# Patient Record
Sex: Female | Born: 1975 | Race: White | Hispanic: No | Marital: Married | Smoking: Never smoker
Health system: Southern US, Community
[De-identification: ages and names within clinical notes are randomized; demographics above are authoritative.]

## PROBLEM LIST (undated history)

## (undated) ENCOUNTER — Ambulatory Visit: Admission: EM | Payer: Self-pay

## (undated) DIAGNOSIS — I371 Nonrheumatic pulmonary valve insufficiency: Secondary | ICD-10-CM

## (undated) DIAGNOSIS — G479 Sleep disorder, unspecified: Secondary | ICD-10-CM

## (undated) DIAGNOSIS — H53149 Visual discomfort, unspecified: Secondary | ICD-10-CM

## (undated) DIAGNOSIS — I493 Ventricular premature depolarization: Secondary | ICD-10-CM

## (undated) DIAGNOSIS — I340 Nonrheumatic mitral (valve) insufficiency: Secondary | ICD-10-CM

## (undated) DIAGNOSIS — I272 Pulmonary hypertension, unspecified: Secondary | ICD-10-CM

## (undated) DIAGNOSIS — I341 Nonrheumatic mitral (valve) prolapse: Secondary | ICD-10-CM

## (undated) DIAGNOSIS — R519 Headache, unspecified: Secondary | ICD-10-CM

## (undated) DIAGNOSIS — I362 Nonrheumatic tricuspid (valve) stenosis with insufficiency: Secondary | ICD-10-CM

## (undated) DIAGNOSIS — R9431 Abnormal electrocardiogram [ECG] [EKG]: Secondary | ICD-10-CM

## (undated) DIAGNOSIS — R0602 Shortness of breath: Secondary | ICD-10-CM

---

## 1999-07-16 HISTORY — PX: LAPAROSCOPIC HYSTERECTOMY: SHX1926

## 2004-05-01 ENCOUNTER — Ambulatory Visit: Payer: Self-pay | Admitting: Family Medicine

## 2012-04-09 ENCOUNTER — Emergency Department: Payer: Self-pay | Admitting: Emergency Medicine

## 2012-04-12 ENCOUNTER — Emergency Department: Payer: Self-pay | Admitting: Emergency Medicine

## 2012-12-17 ENCOUNTER — Ambulatory Visit: Payer: Self-pay | Admitting: Family Medicine

## 2013-01-20 ENCOUNTER — Ambulatory Visit: Payer: Self-pay | Admitting: Family Medicine

## 2017-10-01 ENCOUNTER — Other Ambulatory Visit: Payer: Self-pay | Admitting: Physician Assistant

## 2017-10-01 DIAGNOSIS — Z1231 Encounter for screening mammogram for malignant neoplasm of breast: Secondary | ICD-10-CM

## 2020-09-25 ENCOUNTER — Other Ambulatory Visit: Payer: Self-pay | Admitting: Family Medicine

## 2020-09-25 ENCOUNTER — Ambulatory Visit
Admission: RE | Admit: 2020-09-25 | Discharge: 2020-09-25 | Disposition: A | Discharge status: Home / Self Care | Payer: BC Managed Care – PPO | Attending: Family Medicine | Admitting: Family Medicine

## 2020-09-25 ENCOUNTER — Other Ambulatory Visit: Payer: Self-pay

## 2020-09-25 ENCOUNTER — Ambulatory Visit
Admission: RE | Admit: 2020-09-25 | Discharge: 2020-09-25 | Disposition: A | Discharge status: Home / Self Care | Payer: BC Managed Care – PPO | Source: Ambulatory Visit | Attending: Family Medicine | Admitting: Family Medicine

## 2020-09-25 DIAGNOSIS — S8012XA Contusion of left lower leg, initial encounter: Secondary | ICD-10-CM

## 2021-08-15 ENCOUNTER — Ambulatory Visit
Admission: RE | Admit: 2021-08-15 | Discharge: 2021-08-15 | Disposition: A | Discharge status: Home / Self Care | Payer: 59 | Source: Ambulatory Visit | Attending: Family Medicine | Admitting: Family Medicine

## 2021-08-15 ENCOUNTER — Other Ambulatory Visit: Payer: Self-pay | Admitting: Family Medicine

## 2021-08-15 DIAGNOSIS — M79671 Pain in right foot: Secondary | ICD-10-CM | POA: Insufficient documentation

## 2021-08-15 DIAGNOSIS — S9032XA Contusion of left foot, initial encounter: Secondary | ICD-10-CM

## 2021-12-03 ENCOUNTER — Other Ambulatory Visit: Payer: Self-pay | Admitting: Physician Assistant

## 2021-12-03 DIAGNOSIS — R519 Headache, unspecified: Secondary | ICD-10-CM

## 2021-12-05 ENCOUNTER — Ambulatory Visit
Admission: RE | Admit: 2021-12-05 | Discharge: 2021-12-05 | Disposition: A | Discharge status: Home / Self Care | Payer: 59 | Source: Ambulatory Visit | Attending: Physician Assistant | Admitting: Physician Assistant

## 2021-12-05 ENCOUNTER — Encounter: Payer: Self-pay | Admitting: Radiology

## 2021-12-05 DIAGNOSIS — R519 Headache, unspecified: Secondary | ICD-10-CM | POA: Diagnosis not present

## 2021-12-05 MED ORDER — GADOBUTROL 1 MMOL/ML IV SOLN
6.0000 mL | Freq: Once | INTRAVENOUS | Status: AC | PRN
  Administered 2021-12-05: 6 mL via INTRAVENOUS

## 2022-12-14 NOTE — Anesthesia Preprocedure Evaluation (Signed)
Anesthesia Evaluation  Patient identified by MRN, date of birth, ID band Patient awake    Reviewed: Allergy & Precautions, NPO status , Patient's Chart, lab work & pertinent test results  History of Anesthesia Complications Negative for: history of anesthetic complications  Airway Mallampati: II  TM Distance: >3 FB Neck ROM: Full    Dental no notable dental hx. 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  Rhythm:Regular Rate:Normal - Systolic murmurs TTE 2024:  INTERPRETATION  MILD LV SYSTOLIC DYSFUNCTION (See above)   WITH MILD LVH  NORMAL RIGHT VENTRICULAR SYSTOLIC FUNCTION  MODERATE VALVULAR REGURGITATION (See above)  NO VALVULAR STENOSIS  MODERATE to SEVERE MR  SEVERE LA ENLARGEMENT  MILD TR, PR  EF 45%  _______________     Neuro/Psych negative neurological ROS  negative psych ROS   GI/Hepatic ,neg GERD  ,,(+)     (-) substance abuse    Endo/Other  neg diabetes    Renal/GU negative Renal ROS     Musculoskeletal   Abdominal   Peds  Hematology   Anesthesia Other Findings No past medical history on file.  Reproductive/Obstetrics                             Anesthesia Physical Anesthesia Plan  ASA: 2  Anesthesia Plan: General   Post-op Pain Management: Minimal or no pain anticipated   Induction: Intravenous  PONV Risk Score and Plan: 3 and Propofol infusion, TIVA and Ondansetron  Airway Management Planned: Nasal Cannula  Additional Equipment: None  Intra-op Plan:   Post-operative Plan:   Informed Consent: I have reviewed the patients History and  Physical, chart, labs and discussed the procedure including the risks, benefits and alternatives for the proposed anesthesia with the patient or authorized representative who has indicated his/her understanding and acceptance.     Dental advisory given  Plan Discussed with: CRNA and Surgeon  Anesthesia Plan Comments: (Discussed risks of anesthesia with patient, including possibility of difficulty with spontaneous ventilation under anesthesia necessitating airway intervention, PONV, and rare risks such as cardiac or respiratory or neurological events, and allergic reactions. Discussed the role of CRNA in patient's perioperative care. Patient understands.)       Anesthesia Quick Evaluation

## 2022-12-16 ENCOUNTER — Encounter
Admission: RE | Disposition: A | Discharge status: Home / Self Care | Payer: Self-pay | Source: Home / Self Care | Attending: Cardiovascular Disease

## 2022-12-16 ENCOUNTER — Ambulatory Visit
Admission: RE | Admit: 2022-12-16 | Discharge: 2022-12-16 | Disposition: A | Discharge status: Home / Self Care | Payer: 59 | Source: Home / Self Care | Attending: Cardiology | Admitting: Cardiology

## 2022-12-16 ENCOUNTER — Ambulatory Visit: Payer: 59 | Admitting: Anesthesiology

## 2022-12-16 ENCOUNTER — Ambulatory Visit
Admission: RE | Admit: 2022-12-16 | Discharge: 2022-12-16 | Disposition: A | Discharge status: Home / Self Care | Payer: 59 | Attending: Cardiovascular Disease | Admitting: Cardiovascular Disease

## 2022-12-16 ENCOUNTER — Encounter: Payer: Self-pay | Admitting: Cardiovascular Disease

## 2022-12-16 DIAGNOSIS — I340 Nonrheumatic mitral (valve) insufficiency: Secondary | ICD-10-CM | POA: Insufficient documentation

## 2022-12-16 DIAGNOSIS — I341 Nonrheumatic mitral (valve) prolapse: Secondary | ICD-10-CM | POA: Diagnosis not present

## 2022-12-16 DIAGNOSIS — I517 Cardiomegaly: Secondary | ICD-10-CM | POA: Diagnosis not present

## 2022-12-16 HISTORY — DX: Ventricular premature depolarization: I49.3

## 2022-12-16 HISTORY — DX: Pulmonary hypertension, unspecified: I27.20

## 2022-12-16 HISTORY — DX: Shortness of breath: R06.02

## 2022-12-16 HISTORY — DX: Nonrheumatic tricuspid (valve) stenosis with insufficiency: I36.2

## 2022-12-16 HISTORY — DX: Nonrheumatic pulmonary valve insufficiency: I37.1

## 2022-12-16 HISTORY — DX: Abnormal electrocardiogram (ECG) (EKG): R94.31

## 2022-12-16 HISTORY — DX: Nonrheumatic mitral (valve) prolapse: I34.1

## 2022-12-16 HISTORY — DX: Nonrheumatic mitral (valve) insufficiency: I34.0

## 2022-12-16 HISTORY — DX: Visual discomfort, unspecified: H53.149

## 2022-12-16 HISTORY — DX: Headache, unspecified: R51.9

## 2022-12-16 HISTORY — PX: TEE WITHOUT CARDIOVERSION: SHX5443

## 2022-12-16 HISTORY — DX: Sleep disorder, unspecified: G47.9

## 2022-12-16 LAB — COMPREHENSIVE METABOLIC PANEL
ALT: 14 U/L (ref 0–44)
AST: 19 U/L (ref 15–41)
Albumin: 4.2 g/dL (ref 3.5–5.0)
Alkaline Phosphatase: 43 U/L (ref 38–126)
Anion gap: 11 (ref 5–15)
BUN: 12 mg/dL (ref 6–20)
CO2: 24 mmol/L (ref 22–32)
Calcium: 8.9 mg/dL (ref 8.9–10.3)
Chloride: 106 mmol/L (ref 98–111)
Creatinine, Ser: 0.93 mg/dL (ref 0.44–1.00)
GFR, Estimated: >60 mL/min (ref >60)
Glucose, Bld: 93 mg/dL (ref 70–99)
Potassium: 3.9 mmol/L (ref 3.5–5.1)
Sodium: 141 mmol/L (ref 135–145)
Total Bilirubin: 1 mg/dL (ref 0.3–1.2)
Total Protein: 6.7 g/dL (ref 6.5–8.1)

## 2022-12-16 LAB — ECHO TEE
MV M vel: 5.36 m/s
MV Peak grad: 114.9 mmHg
Radius: 0.9 cm

## 2022-12-16 LAB — CBC
HCT: 38.6 % (ref 36.0–46.0)
Hemoglobin: 12.8 g/dL (ref 12.0–15.0)
MCH: 30.6 pg (ref 26.0–34.0)
MCHC: 33.2 g/dL (ref 30.0–36.0)
MCV: 92.3 fL (ref 80.0–100.0)
Platelets: 191 10*3/uL (ref 150–400)
RBC: 4.18 MIL/uL (ref 3.87–5.11)
RDW: 12.2 % (ref 11.5–15.5)
WBC: 4.5 10*3/uL (ref 4.0–10.5)
nRBC: 0 % (ref 0.0–0.2)

## 2022-12-16 SURGERY — ECHOCARDIOGRAM, TRANSESOPHAGEAL
Anesthesia: General

## 2022-12-16 MED ORDER — PHENYLEPHRINE HCL (PRESSORS) 10 MG/ML IV SOLN
INTRAVENOUS | Status: AC
  Filled 2022-12-16: qty 1

## 2022-12-16 MED ORDER — PROPOFOL 10 MG/ML IV BOLUS
INTRAVENOUS | Status: DC | PRN
  Administered 2022-12-16: 20 mg via INTRAVENOUS
  Administered 2022-12-16: 60 mg via INTRAVENOUS
  Administered 2022-12-16: 30 mg via INTRAVENOUS
  Administered 2022-12-16: 40 mg via INTRAVENOUS
  Administered 2022-12-16: 20 mg via INTRAVENOUS
  Administered 2022-12-16: 10 mg via INTRAVENOUS
  Administered 2022-12-16 (×3): 20 mg via INTRAVENOUS
  Administered 2022-12-16: 40 mg via INTRAVENOUS
  Administered 2022-12-16: 30 mg via INTRAVENOUS
  Administered 2022-12-16: 20 mg via INTRAVENOUS

## 2022-12-16 MED ORDER — PROPOFOL 10 MG/ML IV BOLUS
INTRAVENOUS | Status: AC
  Filled 2022-12-16: qty 20

## 2022-12-16 MED ORDER — MIDAZOLAM HCL 2 MG/2ML IJ SOLN
INTRAMUSCULAR | Status: DC | PRN
  Administered 2022-12-16: 2 mg via INTRAVENOUS

## 2022-12-16 MED ORDER — BUTAMBEN-TETRACAINE-BENZOCAINE 2-2-14 % EX AERO
3.0000 | INHALATION_SPRAY | Freq: Once | CUTANEOUS | Status: AC
  Administered 2022-12-16: 3 via TOPICAL
  Filled 2022-12-16: qty 20

## 2022-12-16 MED ORDER — SUCCINYLCHOLINE CHLORIDE 200 MG/10ML IV SOSY
PREFILLED_SYRINGE | INTRAVENOUS | Status: AC
  Filled 2022-12-16: qty 10

## 2022-12-16 MED ORDER — LIDOCAINE VISCOUS HCL 2 % MT SOLN
OROMUCOSAL | Status: AC
  Filled 2022-12-16: qty 15

## 2022-12-16 MED ORDER — BUTAMBEN-TETRACAINE-BENZOCAINE 2-2-14 % EX AERO
INHALATION_SPRAY | CUTANEOUS | Status: AC
  Filled 2022-12-16: qty 5

## 2022-12-16 MED ORDER — EPHEDRINE 5 MG/ML INJ
INTRAVENOUS | Status: AC
  Filled 2022-12-16: qty 5

## 2022-12-16 MED ORDER — LIDOCAINE HCL (CARDIAC) PF 100 MG/5ML IV SOSY
PREFILLED_SYRINGE | INTRAVENOUS | Status: DC | PRN
  Administered 2022-12-16: 50 mg via INTRAVENOUS

## 2022-12-16 MED ORDER — PROPOFOL 1000 MG/100ML IV EMUL
INTRAVENOUS | Status: AC
  Filled 2022-12-16: qty 100

## 2022-12-16 MED ORDER — MIDAZOLAM HCL 2 MG/2ML IJ SOLN
INTRAMUSCULAR | Status: AC
  Filled 2022-12-16: qty 2

## 2022-12-16 MED ORDER — SODIUM CHLORIDE 0.9 % IV SOLN
INTRAVENOUS | Status: DC
  Administered 2022-12-16: 1000 mL via INTRAVENOUS

## 2022-12-16 NOTE — Progress Notes (Signed)
Transesophageal echocardiogram preliminary report  Jillian Washington [id number] May 27, 1976  Preliminary diagnosis Symptomatic valvular heart disease  Postprocedural diagnosis same  Time out A timeout was performed by the nursing staff and physicians specifically identifying the procedure performed, identification of the patient, the type of sedation, all allergies and medications, all pertinent medical history, and presedation assessment of nasopharynx. The patient and or family understand the risks of the procedure including the rare risks of death, stroke, heart attack, esophogeal perforation, sore throat, and reaction to medications given.  General anesthesia CRNA administered GA (330 mg of propofol).  The patient had continued monitoring of heart rate, oxygenation, blood pressure, respiratory rate, and extent of signs of sedation throughout the entire procedure.  The patient received anesthesia over a period of 20 minutes.  CRNA, nursing staff and I were present during the procedure when the patient was anesthetized for 100% of the time.  Treatment considerations Significant MR 2/2 MVP - full report to follow   For further details of transesophageal echocardiogram please refer to final report.  Lenn Cal [facility] MD M[facility] 12/16/2022 12:57 PM

## 2022-12-16 NOTE — H&P (Signed)
Pre-procedure History & Physical    Patient ID: Jillian Washington MRN: [id number]; DOB: May 29, 1976   Date of procedure: 12/16/2022  Primary Care Provider: Dortha Kern, MD Primary Cardiologist: None   Planned procedure:  TEE  HPI:   Jillian Washington is a 47 y.o. female with MR here for elective TEE  PMH, FH, and SH reviewed  Medications Prior to Admission: Prior to Admission medications   Medication Sig Start Date End Date Taking? Authorizing Provider  flecainide (TAMBOCOR) 50 MG tablet Take 25 mg by mouth 2 (two) times daily.   Yes [provider]  metoprolol tartrate (LOPRESSOR) 50 MG tablet Take 50 mg by mouth at bedtime.   Yes [provider]  Nutritional Supplements (ESTROVEN PO) Take 155 mg by mouth daily.   Yes [provider]  omeprazole (PRILOSEC) 20 MG capsule Take 20 mg by mouth daily.   Yes [provider]  sertraline (ZOLOFT) 50 MG tablet Take 50 mg by mouth daily.   Yes [provider]     Allergies:   No Known Allergies  Social History:   Social History   Socioeconomic History   Marital status: Married    Spouse name: Not on file   Number of children: Not on file   Years of education: Not on file   Highest education level: Not on file  Occupational History   Not on file  Tobacco Use   Smoking status: Not on file   Smokeless tobacco: Not on file  Substance and Sexual Activity   Alcohol use: Not on file   Drug use: Not on file   Sexual activity: Not on file  Other Topics Concern   Not on file  Social History Narrative   Not on file   Social Determinants of Health   Financial Resource Strain: Not on file  Food Insecurity: Not on file  Transportation Needs: Not on file  Physical Activity: Not on file  Stress: Not on file  Social Connections: Not on file  Intimate Partner Violence: Not on file     ROS:  See HPI   Physical Exam/Data:   Vitals:   12/16/22 1137  BP: 134/79  Pulse: (!) 50  Resp: 15   Temp: 98.3 F (36.8 C)  TempSrc: Oral  SpO2: 99%  Weight: 66.2 kg  Height: 5\' 3"  (1.6 m)   No intake or output data in the 24 hours ending 12/16/22 1201    12/16/2022   11:37 AM  Last 3 Weights  Weight (lbs) 146 lb  Weight (kg) 66.225 kg     Body mass index is 25.86 kg/m.  Wt Readings from Last 3 Encounters:  12/16/22 66.2 kg    Physical Exam: General: no acute distress. Head: Normocephalic, atraumatic  Neck: supple Lungs: nl effort Heart: brady, regular w/ ectopy, nl s1 s2, +systolic murmur Abdomen: Soft Msk:  Strength and tone appear normal for age. Extremities: no edema Neuro: awake and alert Psych:  Responds to questions appropriately with a normal affect.    Laboratory Data:  ChemistryNo results for input(s): "NA", "K", "CL", "CO2", "GLUCOSE", "BUN", "CREATININE", "CALCIUM", "GFRNONAA", "GFRAA", "ANIONGAP" in the last 168 hours.  No results for input(s): "PROT", "ALBUMIN", "AST", "ALT", "ALKPHOS", "BILITOT" in the last 168 hours. Hematology Recent Labs  Lab 12/16/22 1148  WBC 4.5  RBC 4.18  HGB 12.8  HCT 38.6  MCV 92.3  MCH 30.6  MCHC 33.2  RDW 12.2  PLT 191    Assessment and  Plan   Will proceed w/ TEE to further eval MR/MVP  ASA III  Anesthesia to be administered by CRNA  Signed, Tiajuana Amass, MD 12/16/2022, 12:01 PM

## 2022-12-16 NOTE — Anesthesia Postprocedure Evaluation (Signed)
Anesthesia Post Note  Patient: Jillian Washington  Procedure(s) Performed: TRANSESOPHAGEAL ECHOCARDIOGRAM (TEE)  Patient location during evaluation: PACU Anesthesia Type: General Level of consciousness: awake and alert Pain management: pain level controlled Vital Signs Assessment: post-procedure vital signs reviewed and stable Respiratory status: spontaneous breathing, nonlabored ventilation, respiratory function stable and patient connected to nasal cannula oxygen Cardiovascular status: blood pressure returned to baseline and stable Postop Assessment: no apparent nausea or vomiting Anesthetic complications: no   No notable events documented.   Last Vitals:  Vitals:   12/16/22 1137 12/16/22 1300  BP: 134/79 124/71  Pulse: (!) 50 (!) 58  Resp: 15 16  Temp: 36.8 C   SpO2: 99% 100%    Last Pain:  Vitals:   12/16/22 1300  TempSrc:   PainSc: 0-No pain                 Corinda Gubler

## 2022-12-16 NOTE — Progress Notes (Signed)
*  PRELIMINARY RESULTS* Echocardiogram Echocardiogram Transesophageal has been performed.  Jillian Washington 12/16/2022, 1:08 PM

## 2022-12-16 NOTE — Transfer of Care (Signed)
Immediate Anesthesia Transfer of Care Note  Patient: Cloee S Balan  Procedure(s) Performed: TRANSESOPHAGEAL ECHOCARDIOGRAM (TEE)  Patient Location: PACU  Anesthesia Type:General  Level of Consciousness: drowsy  Airway & Oxygen Therapy: Patient Spontanous Breathing and Patient connected to nasal cannula oxygen  Post-op Assessment: Report given to RN and Post -op Vital signs reviewed and stable  Post vital signs: stable  Last Vitals:  Vitals Value Taken Time  BP 127/60 12/16/22 1257  Temp    Pulse 47 12/16/22 1258  Resp 16 12/16/22 1258  SpO2 97 % 12/16/22 1258  Vitals shown include unvalidated device data.  Last Pain:  Vitals:   12/16/22 1137  TempSrc: Oral  PainSc: 0-No pain         Complications: No notable events documented.

## 2022-12-17 ENCOUNTER — Encounter: Payer: Self-pay | Admitting: Cardiovascular Disease

## 2023-05-16 HISTORY — PX: MITRAL VALVE REPAIR: SHX2039

## 2023-07-07 ENCOUNTER — Ambulatory Visit (INDEPENDENT_AMBULATORY_CARE_PROVIDER_SITE_OTHER)
Admission: RE | Admit: 2023-07-07 | Discharge: 2023-07-07 | Disposition: A | Discharge status: Home / Self Care | Payer: BC Managed Care – PPO | Source: Ambulatory Visit | Attending: Family Medicine | Admitting: Family Medicine

## 2023-07-07 ENCOUNTER — Other Ambulatory Visit: Payer: Self-pay | Admitting: Family Medicine

## 2023-07-07 ENCOUNTER — Other Ambulatory Visit
Admission: RE | Admit: 2023-07-07 | Discharge: 2023-07-07 | Disposition: A | Discharge status: Home / Self Care | Payer: BC Managed Care – PPO | Source: Home / Self Care | Attending: Family Medicine | Admitting: Family Medicine

## 2023-07-07 ENCOUNTER — Ambulatory Visit
Admission: RE | Admit: 2023-07-07 | Discharge: 2023-07-07 | Disposition: A | Discharge status: Home / Self Care | Payer: BC Managed Care – PPO | Attending: Family Medicine | Admitting: Family Medicine

## 2023-07-07 DIAGNOSIS — R509 Fever, unspecified: Secondary | ICD-10-CM | POA: Insufficient documentation

## 2023-07-07 DIAGNOSIS — R059 Cough, unspecified: Secondary | ICD-10-CM

## 2023-07-07 LAB — COMPREHENSIVE METABOLIC PANEL
ALT: 12 U/L (ref 0–44)
AST: 22 U/L (ref 15–41)
Albumin: 3 g/dL — ABNORMAL LOW (ref 3.5–5.0)
Alkaline Phosphatase: 62 U/L (ref 38–126)
Anion gap: 8 (ref 5–15)
BUN: 9 mg/dL (ref 6–20)
CO2: 23 mmol/L (ref 22–32)
Calcium: 8.6 mg/dL — ABNORMAL LOW (ref 8.9–10.3)
Chloride: 104 mmol/L (ref 98–111)
Creatinine, Ser: 0.69 mg/dL (ref 0.44–1.00)
GFR, Estimated: >60 mL/min (ref >60)
Glucose, Bld: 99 mg/dL (ref 70–99)
Potassium: 4 mmol/L (ref 3.5–5.1)
Sodium: 135 mmol/L (ref 135–145)
Total Bilirubin: 0.5 mg/dL (ref <1.2)
Total Protein: 6.4 g/dL — ABNORMAL LOW (ref 6.5–8.1)

## 2023-07-07 LAB — CBC WITH DIFFERENTIAL/PLATELET
Abs Immature Granulocytes: 0.05 10*3/uL (ref 0.00–0.07)
Basophils Absolute: 0 10*3/uL (ref 0.0–0.1)
Basophils Relative: 0 %
Eosinophils Absolute: 0 10*3/uL (ref 0.0–0.5)
Eosinophils Relative: 0 %
HCT: 27.9 % — ABNORMAL LOW (ref 36.0–46.0)
Hemoglobin: 8.8 g/dL — ABNORMAL LOW (ref 12.0–15.0)
Immature Granulocytes: 1 %
Lymphocytes Relative: 10 %
Lymphs Abs: 1 10*3/uL (ref 0.7–4.0)
MCH: 26.8 pg (ref 26.0–34.0)
MCHC: 31.5 g/dL (ref 30.0–36.0)
MCV: 85.1 fL (ref 80.0–100.0)
Monocytes Absolute: 0.6 10*3/uL (ref 0.1–1.0)
Monocytes Relative: 6 %
Neutro Abs: 8.2 10*3/uL — ABNORMAL HIGH (ref 1.7–7.7)
Neutrophils Relative %: 83 %
Platelets: 298 10*3/uL (ref 150–400)
RBC: 3.28 MIL/uL — ABNORMAL LOW (ref 3.87–5.11)
RDW: 13.9 % (ref 11.5–15.5)
WBC: 9.9 10*3/uL (ref 4.0–10.5)
nRBC: 0 % (ref 0.0–0.2)

## 2024-04-19 ENCOUNTER — Encounter: Payer: Self-pay | Admitting: Cardiology

## 2024-04-19 ENCOUNTER — Other Ambulatory Visit: Payer: Self-pay

## 2024-04-19 ENCOUNTER — Ambulatory Visit
Admission: RE | Admit: 2024-04-19 | Discharge: 2024-04-19 | Disposition: A | Discharge status: Home / Self Care | Attending: Cardiology | Admitting: Cardiology

## 2024-04-19 ENCOUNTER — Ambulatory Visit: Admitting: Anesthesiology

## 2024-04-19 ENCOUNTER — Ambulatory Visit
Admission: RE | Admit: 2024-04-19 | Discharge: 2024-04-19 | Disposition: A | Discharge status: Home / Self Care | Source: Home / Self Care | Attending: Student | Admitting: Student

## 2024-04-19 ENCOUNTER — Encounter
Admission: RE | Disposition: A | Discharge status: Home / Self Care | Payer: Self-pay | Source: Home / Self Care | Attending: Cardiology

## 2024-04-19 DIAGNOSIS — I340 Nonrheumatic mitral (valve) insufficiency: Secondary | ICD-10-CM | POA: Insufficient documentation

## 2024-04-19 DIAGNOSIS — I472 Ventricular tachycardia, unspecified: Secondary | ICD-10-CM | POA: Diagnosis not present

## 2024-04-19 DIAGNOSIS — I2510 Atherosclerotic heart disease of native coronary artery without angina pectoris: Secondary | ICD-10-CM | POA: Diagnosis not present

## 2024-04-19 DIAGNOSIS — I5022 Chronic systolic (congestive) heart failure: Secondary | ICD-10-CM | POA: Diagnosis not present

## 2024-04-19 DIAGNOSIS — Z79899 Other long term (current) drug therapy: Secondary | ICD-10-CM | POA: Insufficient documentation

## 2024-04-19 HISTORY — PX: TEE WITHOUT CARDIOVERSION: SHX5443

## 2024-04-19 SURGERY — ECHOCARDIOGRAM, TRANSESOPHAGEAL
Anesthesia: General

## 2024-04-19 MED ORDER — PHENYLEPHRINE HCL-NACL 20-0.9 MG/250ML-% IV SOLN
INTRAVENOUS | Status: AC
  Filled 2024-04-19: qty 250

## 2024-04-19 MED ORDER — PHENYLEPHRINE 80 MCG/ML (10ML) SYRINGE FOR IV PUSH (FOR BLOOD PRESSURE SUPPORT)
PREFILLED_SYRINGE | INTRAVENOUS | Status: DC | PRN
  Administered 2024-04-19 (×2): 40 ug via INTRAVENOUS

## 2024-04-19 MED ORDER — PROPOFOL 10 MG/ML IV BOLUS
INTRAVENOUS | Status: AC
  Filled 2024-04-19: qty 20

## 2024-04-19 MED ORDER — LIDOCAINE HCL (PF) 2 % IJ SOLN
INTRAMUSCULAR | Status: AC
  Filled 2024-04-19: qty 5

## 2024-04-19 MED ORDER — BUTAMBEN-TETRACAINE-BENZOCAINE 2-2-14 % EX AERO
INHALATION_SPRAY | CUTANEOUS | Status: AC
  Filled 2024-04-19: qty 5

## 2024-04-19 MED ORDER — ONDANSETRON HCL 4 MG/2ML IJ SOLN: 4.0000 mg | Freq: Once | INTRAMUSCULAR | Status: DC | PRN

## 2024-04-19 MED ORDER — PROPOFOL 500 MG/50ML IV EMUL
INTRAVENOUS | Status: DC | PRN
  Administered 2024-04-19 (×4): 10 mg via INTRAVENOUS
  Administered 2024-04-19: 120 mg via INTRAVENOUS
  Administered 2024-04-19 (×6): 10 mg via INTRAVENOUS
  Administered 2024-04-19 (×2): 20 mg via INTRAVENOUS
  Administered 2024-04-19 (×2): 10 mg via INTRAVENOUS
  Administered 2024-04-19: 20 mg via INTRAVENOUS
  Administered 2024-04-19 (×2): 10 mg via INTRAVENOUS
  Administered 2024-04-19: 20 mg via INTRAVENOUS
  Administered 2024-04-19: 10 mg via INTRAVENOUS

## 2024-04-19 MED ORDER — LIDOCAINE VISCOUS HCL 2 % MT SOLN
OROMUCOSAL | Status: AC
  Filled 2024-04-19: qty 15

## 2024-04-19 MED ORDER — LIDOCAINE HCL (CARDIAC) PF 100 MG/5ML IV SOSY
PREFILLED_SYRINGE | INTRAVENOUS | Status: DC | PRN
  Administered 2024-04-19: 100 mg via INTRAVENOUS

## 2024-04-19 MED ORDER — PROPOFOL 10 MG/ML IV BOLUS
INTRAVENOUS | Status: AC
  Filled 2024-04-19: qty 40

## 2024-04-19 MED ORDER — SODIUM CHLORIDE 0.9 % IV SOLN: INTRAVENOUS | Status: DC

## 2024-04-19 NOTE — Anesthesia Preprocedure Evaluation (Addendum)
 Anesthesia Evaluation  Patient identified by MRN, date of birth, ID band Patient awake    Reviewed: Allergy & Precautions, NPO status , Patient's Chart, lab work & pertinent test results  History of Anesthesia Complications Negative for: history of anesthetic complications  Airway Mallampati: I   Neck ROM: Full    Dental  (+) Missing   Pulmonary neg pulmonary ROS   Pulmonary exam normal breath sounds clear to auscultation       Cardiovascular +CHF  Normal cardiovascular exam+ dysrhythmias Supra Ventricular Tachycardia + Valvular Problems/Murmurs (MVP; MR s/p repair)  Rhythm:Regular Rate:Normal  Echo 03/30/24:  NORMAL LEFT VENTRICULAR SYSTOLIC FUNCTION WITH NO LVH  ESTIMATED EF: 55%  NORMAL LA PRESSURES WITH NORMAL DIASTOLIC FUNCTION  NORMAL RIGHT VENTRICULAR SYSTOLIC FUNCTION  VALVULAR REGURGITATION: No AR, MODERATE MR, TRIVIAL PR, MILD TR  ESTIMATED RVSP: 31 mmHg (Normal)  VALVULAR STENOSIS: No AS, PROSTHETIC MV RING, No PS, No TS  Moderate mitral regurgitation with a intravalvular jet and another second regurgitation jet outside the annuloplasty ring laterally.     Neuro/Psych negative neurological ROS     GI/Hepatic ,GERD  ,,  Endo/Other  negative endocrine ROS    Renal/GU negative Renal ROS     Musculoskeletal   Abdominal   Peds  Hematology negative hematology ROS (+)   Anesthesia Other Findings   Reproductive/Obstetrics                              Anesthesia Physical Anesthesia Plan  ASA: 3  Anesthesia Plan: General   Post-op Pain Management:    Induction: Intravenous  PONV Risk Score and Plan: 3 and Propofol  infusion, TIVA and Treatment may vary due to age or medical condition  Airway Management Planned: Natural Airway  Additional Equipment:   Intra-op Plan:   Post-operative Plan:   Informed Consent: I have reviewed the patients History and Physical, chart,  labs and discussed the procedure including the risks, benefits and alternatives for the proposed anesthesia with the patient or authorized representative who has indicated his/her understanding and acceptance.       Plan Discussed with: CRNA  Anesthesia Plan Comments: (LMA/GETA backup discussed.  Patient consented for risks of anesthesia including but not limited to:  - adverse reactions to medications - damage to eyes, teeth, lips or other oral mucosa - nerve damage due to positioning  - sore throat or hoarseness - damage to heart, brain, nerves, lungs, other parts of body or loss of life  Informed patient about role of CRNA in peri- and intra-operative care.  Patient voiced understanding.)         Anesthesia Quick Evaluation

## 2024-04-19 NOTE — Progress Notes (Signed)
*  PRELIMINARY RESULTS* Echocardiogram Echocardiogram Transesophageal has been performed.  Jillian Washington 04/19/2024, 1:05 PM

## 2024-04-19 NOTE — Transfer of Care (Signed)
 Immediate Anesthesia Transfer of Care Note  Patient: Jillian Washington  Procedure(s) Performed: ECHOCARDIOGRAM, TRANSESOPHAGEAL  Patient Location: Cath Lab [facility] Recovery room 20  Anesthesia Type:General  Level of Consciousness: drowsy  Airway & Oxygen Therapy: Patient Spontanous Breathing and Patient connected to nasal cannula oxygen  Post-op Assessment: Report given to RN and Post -op Vital signs reviewed and stable  Post vital signs: Reviewed and stable  Last Vitals:  Vitals Value Taken Time  BP 112/74 1302  Temp 35.8 1302  Pulse 60 1302  Resp 18 1302  SpO2 99 1302    Last Pain:  Vitals:   04/19/24 1155  TempSrc: Oral  PainSc: 0-No pain     Pt. being recovered in SR room 20 where the procedure was completed in.     Complications: No notable events documented.

## 2024-04-19 NOTE — Anesthesia Postprocedure Evaluation (Signed)
 Anesthesia Post Note  Patient: Jillian Washington  Procedure(s) Performed: ECHOCARDIOGRAM, TRANSESOPHAGEAL  Patient location during evaluation: PACU Anesthesia Type: General Level of consciousness: awake and alert, oriented and patient cooperative Pain management: pain level controlled Vital Signs Assessment: post-procedure vital signs reviewed and stable Respiratory status: spontaneous breathing, nonlabored ventilation and respiratory function stable Cardiovascular status: blood pressure returned to baseline and stable Postop Assessment: adequate PO intake Anesthetic complications: no   No notable events documented.   Last Vitals:  Vitals:   04/19/24 1315 04/19/24 1330  BP: 108/71 112/71  Pulse:    Resp:    Temp:    SpO2:  99%    Last Pain:  Vitals:   04/19/24 1330  TempSrc:   PainSc: 0-No pain                 Alfonso Ruths

## 2024-04-20 ENCOUNTER — Encounter: Payer: Self-pay | Admitting: Cardiology

## 2024-04-20 LAB — ECHO TEE
MV M vel: 5.27 m/s
MV Peak grad: 111.1 mmHg
Radius: 1 cm

## 2024-05-17 IMAGING — MR MR HEAD WO/W CM
14 series · 48 of 48 positions shown · IV contrast (gadavist)
Comparison: 6 mL Gadavist intravenous contrast.

CLINICAL DATA: Provided history: Worsening headaches. Additional
history provided by scanning technologist: Patient reports daily
headaches for 1 year. Sharp pain in left temple.

EXAM:
MRI HEAD WITHOUT AND WITH CONTRAST
TECHNIQUE: Multiplanar, multiecho pulse sequences of the brain and surrounding
structures were obtained without and with intravenous contrast.
CONTRAST:  6mL GADAVIST GADOBUTROL 1 MMOL/ML IV SOLN

[Series 5: ax dwi_tracew · axial · 3.0mm · 0.65mm/px · z∈[-99,+56]mm · 3 of 48 slices shown]
[im 1/48]
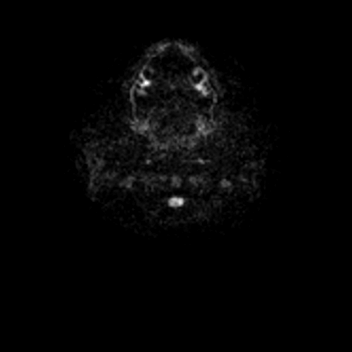
[im 24/48]
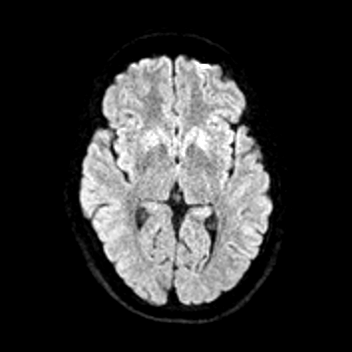
[im 48/48]
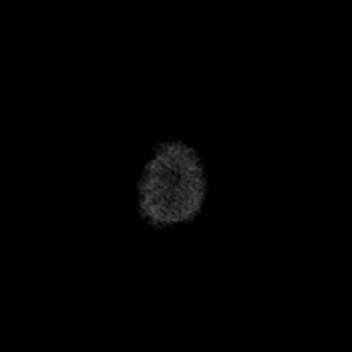

[Series 6: ax dwi_adc · axial · 3.0mm · 0.65mm/px · z∈[-99,+56]mm · 3 of 48 slices shown]
[im 1/48]
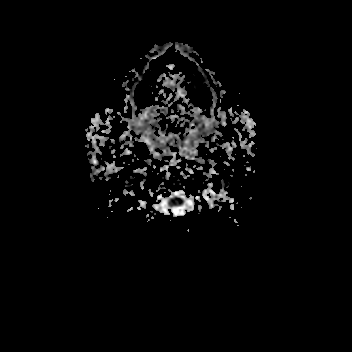
[im 24/48]
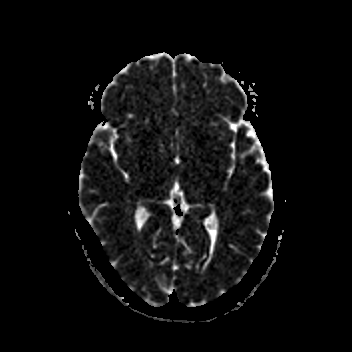
[im 48/48]
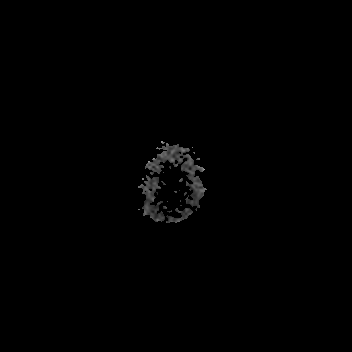

[Series 7: cor dwi_tracew · coronal · 5.0mm · 1.80mm/px · 2 of 38 slices shown]
[im 1/38]
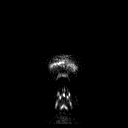
[im 38/38]
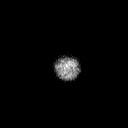

[Series 8: cor dwi_adc · coronal · 5.0mm · 1.80mm/px · 2 of 38 slices shown]
[im 1/38]
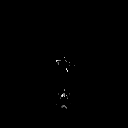
[im 38/38]
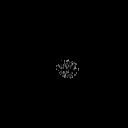

[Series 9: T1 · sagittal · 5.0mm · 0.62mm/px · 1 of 24 slices shown (1 of 2)]
[im 1/24]
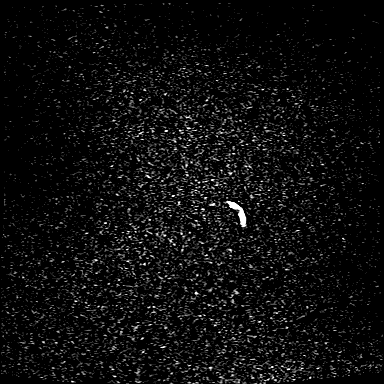

[Series 10: T2 · axial · 5.0mm · 0.53mm/px · 1 of 25 slices shown]
[im 1/25]
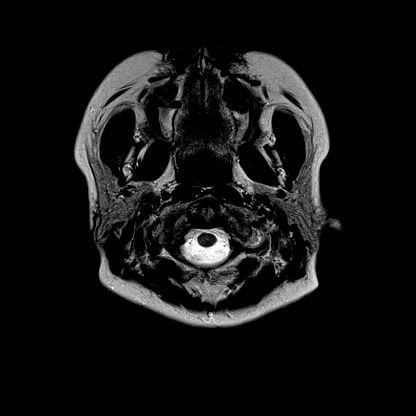

[Series 11: mag_images · axial · 3.0mm · 0.90mm/px · z∈[-110,+67]mm · 3 of 60 slices shown]
[im 1/60]
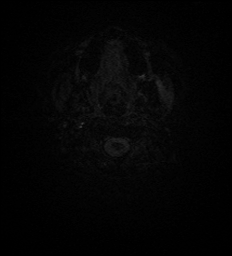
[im 30/60]
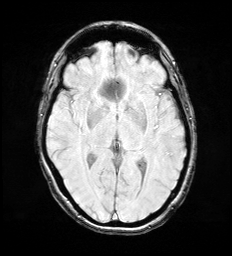
[im 60/60]
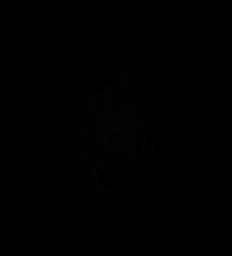

[Series 12: pha_images · axial · 3.0mm · 0.90mm/px · z∈[-110,+67]mm · 3 of 60 slices shown]
[im 1/60]
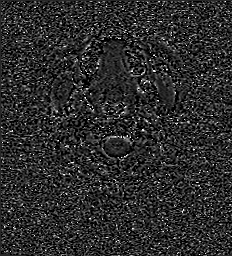
[im 30/60]
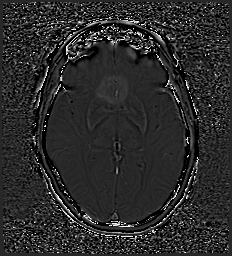
[im 60/60]
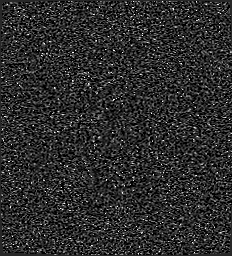

[Series 13: swi_images · axial · 3.0mm · 0.90mm/px · z∈[-110,+67]mm · 3 of 60 slices shown]
[im 1/60]
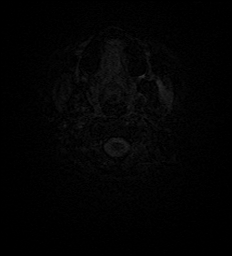
[im 30/60]
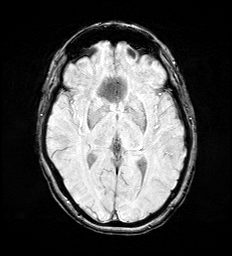
[im 60/60]
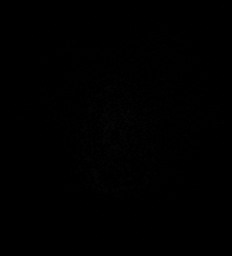

[Series 15: FLAIR · axial · 3.0mm · 0.53mm/px · z∈[-103,+59]mm · 3 of 55 slices shown]
[im 1/55]
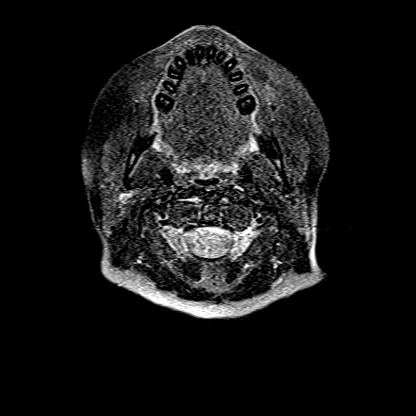
[im 28/55]
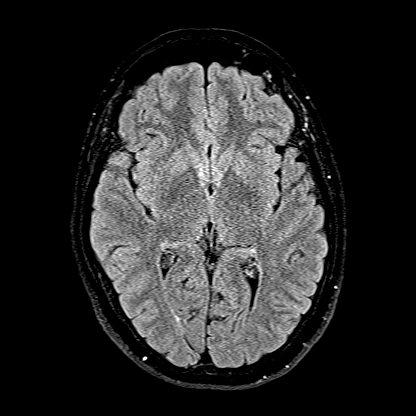
[im 55/55]
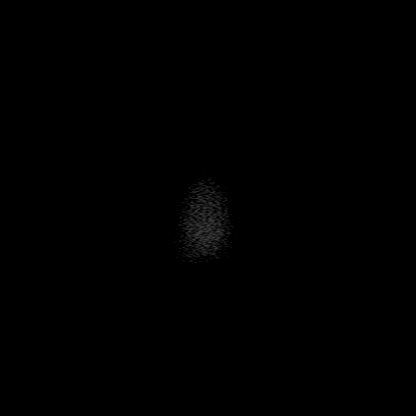

[Series 16: T1 · axial · 1.0mm · 0.98mm/px · z∈[-109,+65]mm · 10 of 176 slices shown (2 of 2)]
[im 1/176]
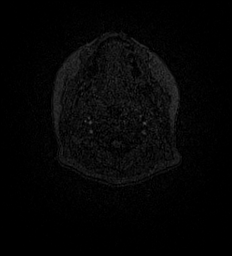
[im 20/176]
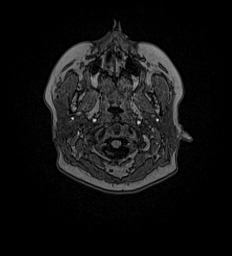
[im 39/176]
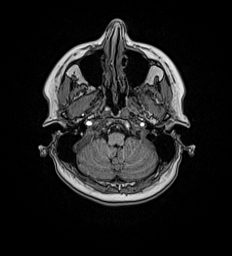
[im 59/176]
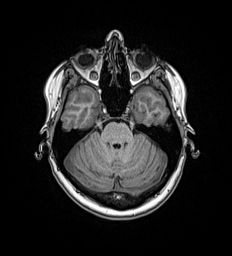
[im 78/176]
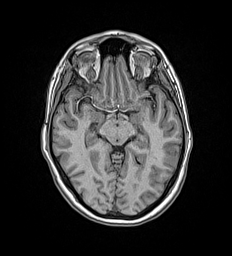
[im 98/176]
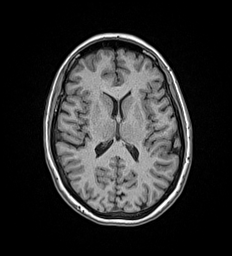
[im 117/176]
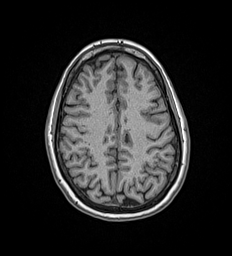
[im 137/176]
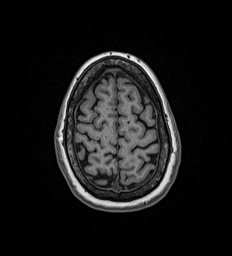
[im 156/176]
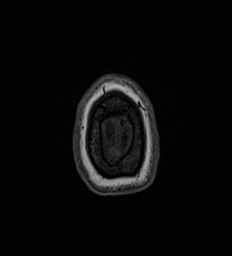
[im 176/176]
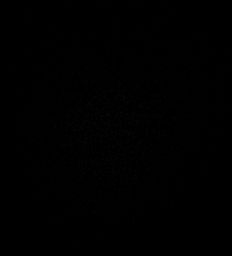

[Series 17: T2 post-contrast · coronal · 5.0mm · 0.57mm/px · 2 of 29 slices shown]
[im 1/29]
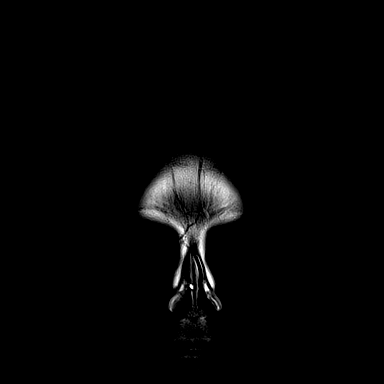
[im 29/29]
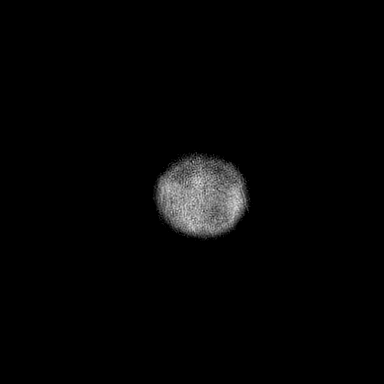

[Series 18: T1 post-contrast · axial · 1.0mm · 0.98mm/px · z∈[-109,+65]mm · 10 of 176 slices shown (1 of 2)]
[im 1/176]
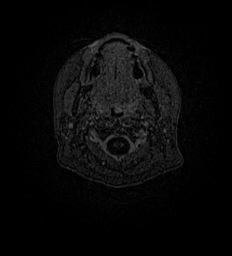
[im 20/176]
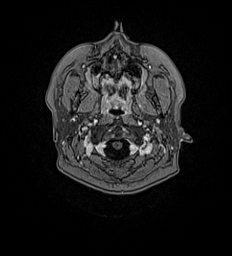
[im 39/176]
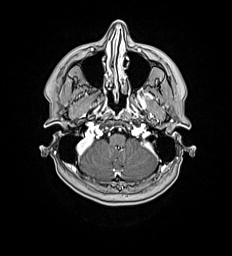
[im 59/176]
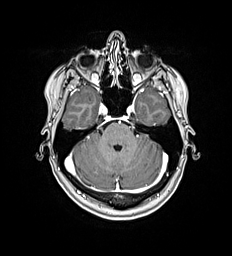
[im 78/176]
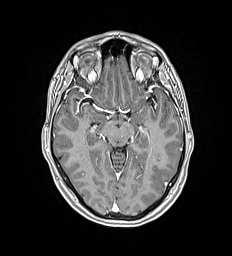
[im 98/176]
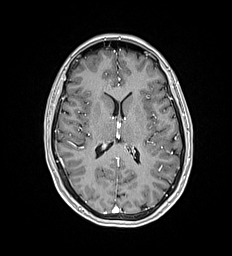
[im 117/176]
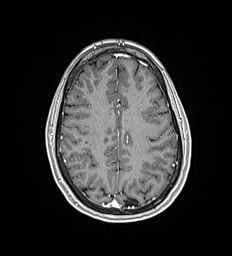
[im 137/176]
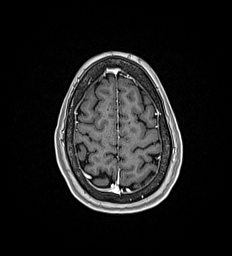
[im 156/176]
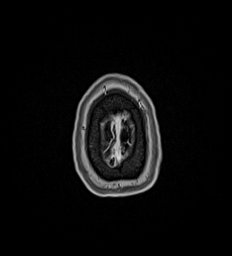
[im 176/176]
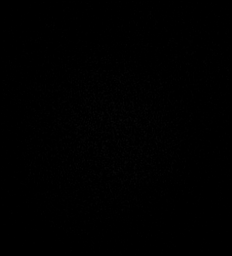

[Series 19: T1 post-contrast · coronal · 5.0mm · 0.57mm/px · 2 of 29 slices shown (2 of 2)]
[im 1/29]
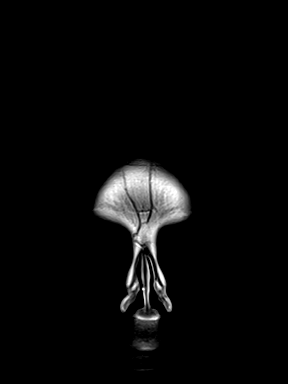
[im 29/29]
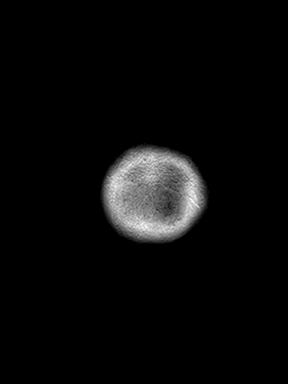

[48 of 48 positions shown; findings below may reference images not displayed]

FINDINGS: Brain:

Cerebral volume is normal.

Right cerebellar tonsillar ectopia. The right cerebellar tonsil
extends 7 mm below the level of the foramen magnum. However, there
is no significant crowding at the level of foramen magnum, and no
mass effect upon the brainstem.

No cortical encephalomalacia is identified. No significant cerebral
white matter disease.

There is no acute infarct.

No evidence of an intracranial mass.

No chronic intracranial blood products.

No extra-axial fluid collection.

No midline shift.

No pathologic intracranial enhancement identified.

Vascular: Maintained flow voids within the proximal large arterial
vessels.

Skull and upper cervical spine: No focal suspicious marrow lesion.

Sinuses/Orbits: No mass or acute finding within the imaged orbits. 4
mm mucous retention cyst within the right frontoethmoidal recess.
Minimal mucosal thickening within the bilateral ethmoid sinuses.
IMPRESSION: No evidence of acute intracranial abnormality.

Right cerebellar tonsillar ectopia (the right cerebellar tonsil
extends 7 mm below the level of foramen magnum). This meet
measurement criteria for a Chiari I malformation. However, there is
no significant crowding at the level of the foramen magnum, and no
mass effect upon the brainstem.

Otherwise unremarkable MRI appearance of the brain.

Mild paranasal sinus disease, as described.
